# Patient Record
Sex: Female | Born: 1955 | Race: White | Hispanic: No | Marital: Single | Smoking: Former smoker
Health system: Southern US, Community
[De-identification: ages and names within clinical notes are randomized; demographics above are authoritative.]

## PROBLEM LIST (undated history)

## (undated) DIAGNOSIS — J8410 Pulmonary fibrosis, unspecified: Secondary | ICD-10-CM

## (undated) HISTORY — PX: ABDOMINAL HYSTERECTOMY: SHX81

---

## 2007-03-18 ENCOUNTER — Other Ambulatory Visit: Payer: Self-pay

## 2007-03-18 ENCOUNTER — Inpatient Hospital Stay: Payer: Self-pay | Admitting: Internal Medicine

## 2007-03-26 ENCOUNTER — Other Ambulatory Visit: Payer: Self-pay

## 2007-03-26 ENCOUNTER — Emergency Department: Payer: Self-pay | Admitting: Emergency Medicine

## 2007-10-27 ENCOUNTER — Emergency Department: Payer: Self-pay | Admitting: Emergency Medicine

## 2007-10-27 ENCOUNTER — Other Ambulatory Visit: Payer: Self-pay

## 2015-12-22 ENCOUNTER — Emergency Department: Payer: Medicaid Other

## 2015-12-22 ENCOUNTER — Encounter: Payer: Self-pay | Admitting: *Deleted

## 2015-12-22 ENCOUNTER — Emergency Department
Admission: EM | Admit: 2015-12-22 | Discharge: 2015-12-22 | Disposition: A | Discharge status: Other Acute Inpatient Hospital | Payer: Medicaid Other | Attending: Nurse Practitioner | Admitting: Nurse Practitioner

## 2015-12-22 DIAGNOSIS — T39395A Adverse effect of other nonsteroidal anti-inflammatory drugs [NSAID], initial encounter: Secondary | ICD-10-CM

## 2015-12-22 DIAGNOSIS — R042 Hemoptysis: Secondary | ICD-10-CM | POA: Diagnosis present

## 2015-12-22 DIAGNOSIS — Z87891 Personal history of nicotine dependence: Secondary | ICD-10-CM | POA: Insufficient documentation

## 2015-12-22 DIAGNOSIS — N189 Chronic kidney disease, unspecified: Secondary | ICD-10-CM | POA: Diagnosis not present

## 2015-12-22 DIAGNOSIS — K922 Gastrointestinal hemorrhage, unspecified: Secondary | ICD-10-CM | POA: Diagnosis not present

## 2015-12-22 LAB — BLOOD GAS, VENOUS
ACID-BASE EXCESS: 9.7 mmol/L — AB (ref 0.0–2.0)
BICARBONATE: 37.2 mmol/L — AB (ref 20.0–28.0)
O2 SAT: 76.6 %
PCO2 VEN: 69 mmHg — AB (ref 44.0–60.0)
PH VEN: 7.34 (ref 7.250–7.430)
Patient temperature: 37
pO2, Ven: 44 mmHg (ref 32.0–45.0)

## 2015-12-22 LAB — COMPREHENSIVE METABOLIC PANEL
ALK PHOS: 143 U/L — AB (ref 38–126)
ALT: 7 U/L — ABNORMAL LOW (ref 14–54)
ANION GAP: 9 (ref 5–15)
AST: 12 U/L — ABNORMAL LOW (ref 15–41)
Albumin: 3.2 g/dL — ABNORMAL LOW (ref 3.5–5.0)
BILIRUBIN TOTAL: 0.6 mg/dL (ref 0.3–1.2)
BUN: 17 mg/dL (ref 6–20)
CALCIUM: 9.3 mg/dL (ref 8.9–10.3)
CO2: 33 mmol/L — ABNORMAL HIGH (ref 22–32)
Chloride: 98 mmol/L — ABNORMAL LOW (ref 101–111)
Creatinine, Ser: 1.57 mg/dL — ABNORMAL HIGH (ref 0.44–1.00)
GFR calc Af Amer: 41 mL/min — ABNORMAL LOW (ref >60)
GFR, EST NON AFRICAN AMERICAN: 35 mL/min — AB (ref >60)
GLUCOSE: 126 mg/dL — AB (ref 65–99)
POTASSIUM: 4.1 mmol/L (ref 3.5–5.1)
Sodium: 140 mmol/L (ref 135–145)
TOTAL PROTEIN: 6.9 g/dL (ref 6.5–8.1)

## 2015-12-22 LAB — TYPE AND SCREEN
ABO/RH(D): O POS
Antibody Screen: NEGATIVE

## 2015-12-22 LAB — APTT: APTT: 33 s (ref 24–36)

## 2015-12-22 LAB — CBC
HEMATOCRIT: 31.7 % — AB (ref 35.0–47.0)
HEMOGLOBIN: 9.9 g/dL — AB (ref 12.0–16.0)
MCH: 27.4 pg (ref 26.0–34.0)
MCHC: 31.2 g/dL — AB (ref 32.0–36.0)
MCV: 87.6 fL (ref 80.0–100.0)
Platelets: 248 10*3/uL (ref 150–440)
RBC: 3.62 MIL/uL — ABNORMAL LOW (ref 3.80–5.20)
RDW: 14.6 % — ABNORMAL HIGH (ref 11.5–14.5)
WBC: 13.3 10*3/uL — ABNORMAL HIGH (ref 3.6–11.0)

## 2015-12-22 LAB — LACTIC ACID, PLASMA: Lactic Acid, Venous: 0.9 mmol/L (ref 0.5–1.9)

## 2015-12-22 LAB — PROTIME-INR
INR: 0.99
Prothrombin Time: 13.1 seconds (ref 11.4–15.2)

## 2015-12-22 MED ORDER — HYDROMORPHONE HCL 1 MG/ML IJ SOLN
0.5000 mg | Freq: Once | INTRAMUSCULAR | Status: AC
  Administered 2015-12-22: 0.5 mg via INTRAVENOUS
  Filled 2015-12-22: qty 1

## 2015-12-22 MED ORDER — METHYLPREDNISOLONE SODIUM SUCC 125 MG IJ SOLR
125.0000 mg | Freq: Once | INTRAMUSCULAR | Status: AC
Start: 2015-12-22 — End: 2015-12-22
  Administered 2015-12-22: 125 mg via INTRAVENOUS
  Filled 2015-12-22: qty 2

## 2015-12-22 MED ORDER — SODIUM CHLORIDE 0.9 % IV SOLN
80.0000 mg | Freq: Once | INTRAVENOUS | Status: AC
  Administered 2015-12-22: 80 mg via INTRAVENOUS
  Filled 2015-12-22: qty 80

## 2015-12-22 MED ORDER — SODIUM CHLORIDE 0.9 % IV BOLUS (SEPSIS)
1000.0000 mL | Freq: Once | INTRAVENOUS | Status: AC
  Administered 2015-12-22: 1000 mL via INTRAVENOUS

## 2015-12-22 MED ORDER — LEVOFLOXACIN IN D5W 750 MG/150ML IV SOLN
750.0000 mg | Freq: Once | INTRAVENOUS | Status: AC
  Administered 2015-12-22: 750 mg via INTRAVENOUS
  Filled 2015-12-22: qty 150

## 2015-12-22 MED ORDER — SODIUM CHLORIDE 0.9 % IV SOLN
8.0000 mg/h | INTRAVENOUS | Status: DC
  Administered 2015-12-22: 8 mg/h via INTRAVENOUS
  Filled 2015-12-22 (×2): qty 80

## 2015-12-22 MED ORDER — IPRATROPIUM-ALBUTEROL 0.5-2.5 (3) MG/3ML IN SOLN
3.0000 mL | Freq: Once | RESPIRATORY_TRACT | Status: AC
  Administered 2015-12-22: 3 mL via RESPIRATORY_TRACT
  Filled 2015-12-22: qty 3

## 2015-12-22 NOTE — ED Provider Notes (Signed)
[facility] Emergency Department Provider Note  ____________________________________________  Time seen: Approximately 3:38 PM  I have reviewed the triage vital signs and the nursing notes.   HISTORY  Chief Complaint Hemoptysis    HPI Tricia Montgomery is a 60 y.o. female with a history of idiopathic pulmonary fibrosis on 16 L of oxygen by nasal cannula at home, known gastric ulcers recently started on Mobitz for arthritis pain, CKD, presenting with hemoptysis and black stool.The patient reports that last week she had a worsening of her chronic cough associated with a change in her sputum, which became green and thick, with pink blood-tinged. Over the last several days, the sputum has improved but now she is having frank hemoptysis with continued increased cough. She has associated worsening of her shortness of breath, which is worse with exertion. She also has a central chest tightness sensation. She has had lightheadedness with standing but no syncope. No palpitations, no fever. Positive chills. No lower extremity swelling or calf pain. In addition, the patient notes that for the last several days she has had black colored stools; no nausea or vomiting, no epigastric pain. In addition, the patient reports significant anxiety when she feels short of breath. The patient is not anticoagulated.   No past medical history on file.  There are no active problems to display for this patient.   No past surgical history on file.    Allergies Review of patient's allergies indicates no known allergies.  No family history on file.  Social History Social History  Substance Use Topics  . Smoking status: Former Games developer  . Smokeless tobacco: Never Used  . Alcohol use No    Review of Systems Constitutional:No fever. Positive chills. Positive lightheadedness. Negative syncope. Eyes: No visual changes. ENT: No sore throat. No congestion or rhinorrhea. Cardiovascular:  Positive chest pain. Denies palpitations. Respiratory: Positive shortness of breath.  Positive cough. Positive hemoptysis. Gastrointestinal: No abdominal pain.  No nausea, no vomiting.  No diarrhea.  No constipation. Positive black stool. Genitourinary: Negative for dysuria. Musculoskeletal: Positive for chronic unchanged back pain. Skin: Negative for rash. Neurological: Negative for headaches. No focal numbness, tingling or weakness.  Psychiatric:Positive anxiety.  10-point ROS otherwise negative.  ____________________________________________   PHYSICAL EXAM:  VITAL SIGNS: ED Triage Vitals  Enc Vitals Group     BP 12/22/15 1523 105/69     Pulse Rate 12/22/15 1523 80     Resp 12/22/15 1523 (!) 22     Temp 12/22/15 1523 98.4 F (36.9 C)     Temp Source 12/22/15 1523 Oral     SpO2 12/22/15 1523 96 %     Weight 12/22/15 1525 200 lb (90.7 kg)     Height 12/22/15 1525 5\' 5"  (1.651 m)     Head Circumference --      Peak Flow --      Pain Score 12/22/15 1525 9     Pain Loc --      Pain Edu? --      Excl. in GC? --     Constitutional: Alert and oriented. Chronically ill appearing but nontoxic Answers questions appropriately. Eyes: Conjunctivae are normal and without pallor.  EOMI. No scleral icterus. Head: Atraumatic. Nose: No congestion/rhinnorhea. Mouth/Throat: Mucous membranes are moist.  Neck: No stridor.  Supple.  No JVD. No meningismus. Cardiovascular: Normal rate, regular rhythm. No murmurs, rubs or gallops.  Respiratory: Tachypnea with accessory muscle use and retractions. Oxygenation is 99% on baseline a liters nasal cannula. Diffuse  expiratory greater than inspiratory wheezing bilaterally. Rales in the bases bilaterally. Fair air exchange. Gastrointestinal: Obese. Soft, nontender and nondistended.  No guarding or rebound.  No peritoneal signs. Genitourinary: Nonthrombosed, nonbleeding external hemorrhoids. No palpable internal hemorrhoids. No pain with rectal  examination. Stool is brown and guaiac positive. Musculoskeletal: No LE edema. No ttp in the calves or palpable cords.  Negative Homan's sign. Neurologic:  A&Ox3.  Speech is clear.  Face and smile are symmetric.  EOMI.  Moves all extremities well. Skin:  Skin is warm, dry and intact. No rash noted. Psychiatric: Mood and affect are normal. Speech and behavior are normal.  Normal judgement.  ____________________________________________   LABS (all labs ordered are listed, but only abnormal results are displayed)  Labs Reviewed  CBC - Abnormal; Notable for the following:       Result Value   WBC 13.3 (*)    RBC 3.62 (*)    Hemoglobin 9.9 (*)    HCT 31.7 (*)    MCHC 31.2 (*)    RDW 14.6 (*)    All other components within normal limits  COMPREHENSIVE METABOLIC PANEL - Abnormal; Notable for the following:    Chloride 98 (*)    CO2 33 (*)    Glucose, Bld 126 (*)    Creatinine, Ser 1.57 (*)    Albumin 3.2 (*)    AST 12 (*)    ALT 7 (*)    Alkaline Phosphatase 143 (*)    GFR calc non Af Amer 35 (*)    GFR calc Af Amer 41 (*)    All other components within normal limits  BLOOD GAS, VENOUS - Abnormal; Notable for the following:    pCO2, Ven 69 (*)    Bicarbonate 37.2 (*)    Acid-Base Excess 9.7 (*)    All other components within normal limits  CULTURE, BLOOD (ROUTINE X 2)  CULTURE, BLOOD (ROUTINE X 2)  APTT  PROTIME-INR  LACTIC ACID, PLASMA  LACTIC ACID, PLASMA  TROPONIN I  TYPE AND SCREEN   ____________________________________________  EKG  ED ECG REPORT I, Rockne Menghini, the attending physician, personally viewed and interpreted this ECG.   Date: 12/22/2015  EKG Time: 1533  Rate: 83  Rhythm: normal sinus rhythm  Axis: leftward  Intervals:none  ST&T Change: Poor baseline tracing but no obvious ST elevation or depression.  ____________________________________________  RADIOLOGY  No results  found.  ____________________________________________   PROCEDURES  Procedure(s) performed: None  Procedures  Critical Care performed: Yes, see critical care note(s) ____________________________________________   INITIAL IMPRESSION / ASSESSMENT AND PLAN / ED COURSE  Pertinent labs & imaging results that were available during my care of the patient were reviewed by me and considered in my medical decision making (see chart for details).  60 y.o. female with a history of idiopathic pulmonary fibrosis on supplemental oxygen, known ulcers, not anticoagulated but recently placed on low back for arthritic pain, presenting with cough, shortness of breath, chills, hemoptysis, and black stools with a guaiac positive finding on examination. Overall, the patient has a reassuring blood pressure and heart rate and is mentating well. However she is actively coughing up blood. Now that she has a guaiac positive test as well, I am unable to admit her to the hospital [facility] due to not having GI coverage. The patient has all her primary pulmonary and gastroenterology physicians at [facility], so called the transfer center. I am concerned that the patient may have hemoptysis from an underlying  either viral or bacterial pulmonary infection, and we'll treat her with empiric antibiotics immediately. She is also at risk for PE, however, states that she has CKD and has not been eligible for CT angiogram in the past. I will evaluate her creatinine when he comes back.  At this time however the patient is not more hypoxic than baseline, is not unstable, so imaging can be deferred for kidney protection until we have further data. She may also have bleeding gastric ulcers, and I will initiate Protonix. If she is anemic, we will order a blood transfusion here. I'll continue to monitor the patient's vital signs and her clinical course for further treatment decisions.  CRITICAL CARE Performed by: Rockne MenghiniNorman,  Anne-Caroline   Total critical care time: 60 minutes  Critical care time was exclusive of separately billable procedures and treating other patients.  Critical care was necessary to treat or prevent imminent or life-threatening deterioration.  Critical care was time spent personally by me on the following activities: development of treatment plan with patient and/or surrogate as well as nursing, discussions with consultants, evaluation of patient's response to treatment, examination of patient, obtaining history from patient or surrogate, ordering and performing treatments and interventions, ordering and review of laboratory studies, ordering and review of radiographic studies, pulse oximetry and re-evaluation of patient's condition.  ----------------------------------------- 4:41 PM on 12/22/2015 -----------------------------------------  At this time, the patient remains hemodynamically stable. [facility]. Her hemoglobin and hematocrit are stable compared to previous, and no transfusion is indicated at this time. She has received IV antibiotics, as well as Solu-Medrol and a DuoNeb. [facility] is aware that I have held CT angiography due to the patient's chronic renal insufficiency.  ____________________________________________  FINAL CLINICAL IMPRESSION(S) / ED DIAGNOSES  Final diagnoses:  Hemoptysis  GI bleed due to NSAIDs    Clinical Course      NEW MEDICATIONS STARTED DURING THIS VISIT:  New Prescriptions   No medications on file       Rockne MenghiniAnne-Caroline Brookelyn Gaynor, MD 12/22/15 1642

## 2015-12-22 NOTE — ED Triage Notes (Addendum)
Pt to triage via wheelchair.  Pt on 6-8 liters oxygen at home.  Pt has a form of fibrosis of lungs.  Former smoker.  Intermittent sob.  Pt states she is coughing up blood and it  Is worse during past 3 days.  Pt coughing up bright red blood in triage.   Pt alert.

## 2015-12-27 LAB — CULTURE, BLOOD (ROUTINE X 2)
CULTURE: NO GROWTH
Culture: NO GROWTH

## 2016-01-10 ENCOUNTER — Encounter: Payer: Self-pay | Admitting: Emergency Medicine

## 2016-01-10 ENCOUNTER — Emergency Department: Payer: Medicaid Other

## 2016-01-10 ENCOUNTER — Emergency Department
Admission: EM | Admit: 2016-01-10 | Discharge: 2016-01-10 | Disposition: A | Discharge status: Home / Self Care | Payer: Medicaid Other | Attending: Emergency Medicine | Admitting: Emergency Medicine

## 2016-01-10 DIAGNOSIS — Z79899 Other long term (current) drug therapy: Secondary | ICD-10-CM | POA: Insufficient documentation

## 2016-01-10 DIAGNOSIS — J9601 Acute respiratory failure with hypoxia: Secondary | ICD-10-CM

## 2016-01-10 DIAGNOSIS — R401 Stupor: Secondary | ICD-10-CM

## 2016-01-10 DIAGNOSIS — R4182 Altered mental status, unspecified: Secondary | ICD-10-CM | POA: Diagnosis present

## 2016-01-10 DIAGNOSIS — Z87891 Personal history of nicotine dependence: Secondary | ICD-10-CM | POA: Diagnosis not present

## 2016-01-10 DIAGNOSIS — R0602 Shortness of breath: Secondary | ICD-10-CM | POA: Diagnosis not present

## 2016-01-10 HISTORY — DX: Pulmonary fibrosis, unspecified: J84.10

## 2016-01-10 LAB — CBC WITH DIFFERENTIAL/PLATELET
BASOS ABS: 0 10*3/uL (ref 0–0.1)
BASOS PCT: 0 %
Eosinophils Absolute: 0 10*3/uL (ref 0–0.7)
Eosinophils Relative: 0 %
HEMATOCRIT: 33.8 % — AB (ref 35.0–47.0)
HEMOGLOBIN: 10.4 g/dL — AB (ref 12.0–16.0)
LYMPHS PCT: 4 %
Lymphs Abs: 0.4 10*3/uL — ABNORMAL LOW (ref 1.0–3.6)
MCH: 27.4 pg (ref 26.0–34.0)
MCHC: 30.6 g/dL — ABNORMAL LOW (ref 32.0–36.0)
MCV: 89.3 fL (ref 80.0–100.0)
Monocytes Absolute: 0.8 10*3/uL (ref 0.2–0.9)
Monocytes Relative: 7 %
NEUTROS ABS: 10.8 10*3/uL — AB (ref 1.4–6.5)
NEUTROS PCT: 89 %
Platelets: 478 10*3/uL — ABNORMAL HIGH (ref 150–440)
RBC: 3.78 MIL/uL — AB (ref 3.80–5.20)
RDW: 15.5 % — ABNORMAL HIGH (ref 11.5–14.5)
WBC: 12.1 10*3/uL — AB (ref 3.6–11.0)

## 2016-01-10 LAB — COMPREHENSIVE METABOLIC PANEL
ALBUMIN: 3.6 g/dL (ref 3.5–5.0)
ALT: 10 U/L — AB (ref 14–54)
AST: 18 U/L (ref 15–41)
Alkaline Phosphatase: 168 U/L — ABNORMAL HIGH (ref 38–126)
Anion gap: 7 (ref 5–15)
BILIRUBIN TOTAL: 0.3 mg/dL (ref 0.3–1.2)
BUN: 14 mg/dL (ref 6–20)
CO2: 40 mmol/L — ABNORMAL HIGH (ref 22–32)
CREATININE: 1.58 mg/dL — AB (ref 0.44–1.00)
Calcium: 8.9 mg/dL (ref 8.9–10.3)
Chloride: 91 mmol/L — ABNORMAL LOW (ref 101–111)
GFR calc Af Amer: 41 mL/min — ABNORMAL LOW (ref >60)
GFR, EST NON AFRICAN AMERICAN: 35 mL/min — AB (ref >60)
GLUCOSE: 180 mg/dL — AB (ref 65–99)
Potassium: 3.8 mmol/L (ref 3.5–5.1)
Sodium: 138 mmol/L (ref 135–145)
TOTAL PROTEIN: 7.5 g/dL (ref 6.5–8.1)

## 2016-01-10 LAB — BLOOD GAS, ARTERIAL
ACID-BASE EXCESS: 16.3 mmol/L — AB (ref 0.0–2.0)
BICARBONATE: 46.9 mmol/L — AB (ref 20.0–28.0)
FIO2: 1
O2 Saturation: 99.6 %
PCO2 ART: 107 mmHg — AB (ref 32.0–48.0)
PH ART: 7.25 — AB (ref 7.350–7.450)
PO2 ART: 201 mmHg — AB (ref 83.0–108.0)
Patient temperature: 37

## 2016-01-10 LAB — URINALYSIS COMPLETE WITH MICROSCOPIC (ARMC ONLY)
Bilirubin Urine: NEGATIVE
Glucose, UA: NEGATIVE mg/dL
Hgb urine dipstick: NEGATIVE
KETONES UR: NEGATIVE mg/dL
NITRITE: POSITIVE — AB
PH: 5 (ref 5.0–8.0)
PROTEIN: NEGATIVE mg/dL
SPECIFIC GRAVITY, URINE: 1.011 (ref 1.005–1.030)

## 2016-01-10 LAB — BRAIN NATRIURETIC PEPTIDE: B Natriuretic Peptide: 119 pg/mL — ABNORMAL HIGH (ref 0.0–100.0)

## 2016-01-10 LAB — LACTIC ACID, PLASMA: LACTIC ACID, VENOUS: 1.3 mmol/L (ref 0.5–1.9)

## 2016-01-10 LAB — PROTIME-INR
INR: 1
Prothrombin Time: 13.2 seconds (ref 11.4–15.2)

## 2016-01-10 LAB — TYPE AND SCREEN
ABO/RH(D): O POS
Antibody Screen: NEGATIVE

## 2016-01-10 LAB — GLUCOSE, CAPILLARY: GLUCOSE-CAPILLARY: 186 mg/dL — AB (ref 65–99)

## 2016-01-10 LAB — TROPONIN I: Troponin I: 0.04 ng/mL (ref <0.03)

## 2016-01-10 LAB — LIPASE, BLOOD: LIPASE: 13 U/L (ref 11–51)

## 2016-01-10 MED ORDER — SODIUM CHLORIDE 0.9 % IV BOLUS (SEPSIS)
1000.0000 mL | Freq: Once | INTRAVENOUS | Status: AC
  Administered 2016-01-10: 1000 mL via INTRAVENOUS

## 2016-01-10 NOTE — ED Notes (Signed)
Patient family instructed on catheter care for patient

## 2016-01-10 NOTE — ED Notes (Signed)
Dr. Fanny BienQuale at bedside preparing to intubate

## 2016-01-10 NOTE — ED Notes (Addendum)
Patient [facility] arrived at bedside, Dr. Fanny BienQuale currently speaking with daughter. Per daughter patient does not want to be intubated or resuscitated.

## 2016-01-10 NOTE — ED Provider Notes (Signed)
[facility] Emergency Department Provider Note   ____________________________________________   First MD Initiated Contact with Patient 01/10/16 1000     (approximate)  I have reviewed the triage vital signs and the nursing notes.   HISTORY  Chief Complaint Respiratory Distress and Altered Mental Status  EM caveat: The patient is completely obtunded and unresponsive  HPI Tricia Montgomery is a 60 y.o. female the previous history of severe interstitial fibrosis  EMS reports that the patient became or was found unresponsive today, had vomited once, and has rales throughout the lungs. She cannot tolerate CPAP with her altered mental status, and her oxygen saturation was notably low and hypoxic with some improvement on nonrebreather  The patient's daughter arrived at about 10:30 AM, she is the patient's healthcare power attorney  She last talked to her mother yesterday and said that she sounded normal on the phone, this morning she reports that family who she was living with found her unresponsive and having trouble breathing. The daughter affirms to me that her mother is DO NOT RESUSCITATE, she would not want anything heroic done when we discussed it, and they may be amenable to fluids, antibiotics, relief of pain and anxiety but certainly do not wish for her to have CPR, or any form of resuscitation or mechanical ventilation.  discussed with [facility] who follows her, they're coming to see and evaluate the patient in the ER now  Past Medical History:  Diagnosis Date  . Pulmonary fibrosis (HCC)     There are no active problems to display for this patient.   Past Surgical History:  Procedure Laterality Date  . ABDOMINAL HYSTERECTOMY      Prior to Admission medications   Medication Sig Start Date End Date Taking? Authorizing Provider  acetaminophen (TYLENOL) 500 MG chewable tablet Chew 1,000 mg by mouth 3 (three) times daily as needed for pain.    Yes Historical Provider, MD  albuterol (PROVENTIL HFA;VENTOLIN HFA) 108 (90 Base) MCG/ACT inhaler Inhale 2 puffs into the lungs every 6 (six) hours as needed for wheezing.   Yes Historical Provider, MD  beclomethasone (QVAR) 80 MCG/ACT inhaler Inhale 2 puffs into the lungs 2 (two) times daily.   Yes Historical Provider, MD  calcitRIOL (ROCALTROL) 0.5 MCG capsule Take 0.5 mcg by mouth daily.   Yes Historical Provider, MD  cyclobenzaprine (FLEXERIL) 5 MG tablet Take 10 mg by mouth 3 (three) times daily as needed for muscle spasms.   Yes Historical Provider, MD  diazepam (VALIUM) 2 MG tablet Take 2 mg by mouth every 6 (six) hours as needed for anxiety.   Yes Historical Provider, MD  epoetin alfa (EPOGEN,PROCRIT) 16109 UNIT/ML injection Inject 10,000 Units into the skin 3 (three) times a week. On Monday, Wednesday, and Saturday   Yes Historical Provider, MD  furosemide (LASIX) 40 MG tablet Take 40 mg by mouth 2 (two) times daily.   Yes Historical Provider, MD  gabapentin (NEURONTIN) 300 MG capsule Take 300 mg by mouth 3 (three) times daily. Along with gabapentin 400 mg   Yes Historical Provider, MD  gabapentin (NEURONTIN) 400 MG capsule Take 400 mg by mouth 3 (three) times daily. Along with gabapentin 300 mg   Yes Historical Provider, MD  HYDROmorphone (DILAUDID) 2 MG tablet Take 2 mg by mouth every 4 (four) hours as needed for moderate pain.   Yes Historical Provider, MD  meloxicam (MOBIC) 15 MG tablet Take 15 mg by mouth daily.   Yes Historical Provider, MD  mycophenolate (CELLCEPT) 500 MG tablet Take 1,500 mg by mouth 2 (two) times daily.   Yes Historical Provider, MD  nitroGLYCERIN (NITROSTAT) 0.4 MG SL tablet Place 0.4 mg under the tongue every 5 (five) minutes as needed for chest pain.   Yes Historical Provider, MD  omeprazole (PRILOSEC) 20 MG capsule Take 40 mg by mouth 2 (two) times daily with a meal.   Yes Historical Provider, MD  promethazine (PHENERGAN) 25 MG tablet Take 25 mg by mouth every 8  (eight) hours as needed for nausea.   Yes Historical Provider, MD  tiotropium (SPIRIVA) 18 MCG inhalation capsule Place 18 mcg into inhaler and inhale daily.   Yes Historical Provider, MD  verapamil (CALAN-SR) 240 MG CR tablet Take 240 mg by mouth 2 (two) times daily.   Yes Historical Provider, MD    Allergies Review of patient's allergies indicates no known allergies.  No family history on file.  Social History Social History  Substance Use Topics  . Smoking status: Former Games developer  . Smokeless tobacco: Never Used  . Alcohol use No    Review of Systems EM caveat  ____________________________________________   PHYSICAL EXAM:  VITAL SIGNS: ED Triage Vitals  Enc Vitals Group     BP 01/10/16 1009 110/74     Pulse Rate 01/10/16 1009 100     Resp --      Temp --      Temp src --      SpO2 01/10/16 1009 99 %     Weight 01/10/16 1016 207 lb 0.2 oz (93.9 kg)     Height 01/10/16 1016 5\' 1"  (1.549 m)     Head Circumference --      Peak Flow --      Pain Score --      Pain Loc --      Pain Edu? --      Excl. in GC? --   Constitutional: Essentially completely unresponsive. Patient obtunded, responds only to painful stimuli with grimacing. Nonverbal Eyes: Conjunctivae are normal. Pupils are midpoint, reactive but slow Head: Atraumatic. Nose: No congestion/rhinnorhea. Mouth/Throat: Mucous membranes are very dry, edentulous.  Oropharynx non-erythematous. Neck: No stridor.   Cardiovascular: Tachycardic rate, regular rhythm. Grossly normal heart sounds.  Good peripheral circulation. Respiratory: Diminished, somewhat inadequate respirations though not agonal. The patient has rales throughout. Gastrointestinal: Soft and nontender. No distention.  Musculoskeletal: No lower extremity tenderness noted though the patient does have 1+ lower extremity edema bilateral Neurologic:  Flaccid in all extremities. Obtunded, does withdraw and react to painful stimuli only. Occasionally opens eyes  to voice but does not Indicate back. Patient felt to be obtunded, Skin:  Skin is warm, dry and intact. No rash noted.   ____________________________________________   LABS (all labs ordered are listed, but only abnormal results are displayed)  Labs Reviewed  GLUCOSE, CAPILLARY - Abnormal; Notable for the following:       Result Value   Glucose-Capillary 186 (*)    All other components within normal limits  CBC WITH DIFFERENTIAL/PLATELET - Abnormal; Notable for the following:    WBC 12.1 (*)    RBC 3.78 (*)    Hemoglobin 10.4 (*)    HCT 33.8 (*)    MCHC 30.6 (*)    RDW 15.5 (*)    Platelets 478 (*)    Neutro Abs 10.8 (*)    Lymphs Abs 0.4 (*)    All other components within normal limits  COMPREHENSIVE METABOLIC PANEL - Abnormal; Notable for  the following:    Chloride 91 (*)    CO2 40 (*)    Glucose, Bld 180 (*)    Creatinine, Ser 1.58 (*)    ALT 10 (*)    Alkaline Phosphatase 168 (*)    GFR calc non Af Amer 35 (*)    GFR calc Af Amer 41 (*)    All other components within normal limits  TROPONIN I - Abnormal; Notable for the following:    Troponin I 0.04 (*)    All other components within normal limits  URINALYSIS COMPLETEWITH MICROSCOPIC ([facility] ONLY) - Abnormal; Notable for the following:    Color, Urine YELLOW (*)    APPearance CLEAR (*)    Nitrite POSITIVE (*)    Leukocytes, UA TRACE (*)    Bacteria, UA MANY (*)    Squamous Epithelial / LPF 0-5 (*)    All other components within normal limits  BRAIN NATRIURETIC PEPTIDE - Abnormal; Notable for the following:    B Natriuretic Peptide 119.0 (*)    All other components within normal limits  BLOOD GAS, ARTERIAL - Abnormal; Notable for the following:    pH, Arterial 7.25 (*)    pCO2 arterial 107 (*)    pO2, Arterial 201 (*)    Bicarbonate 46.9 (*)    Acid-Base Excess 16.3 (*)    All other components within normal limits  CULTURE, BLOOD (ROUTINE X 2)  CULTURE, BLOOD (ROUTINE X 2)  URINE CULTURE  LACTIC ACID,  PLASMA  PROTIME-INR  LIPASE, BLOOD  TYPE AND SCREEN   ____________________________________________  EKG  Reviewed and to revive me at 10:05 AM Heart rate 100 QRS 100 QTc 4:30 Sinus tachycardia, slight left axis deviation Nonspecific intraventricular conduction delay, no evidence of acute ischemic change noted ____________________________________________  RADIOLOGY  Dg Chest Port 1 View  Result Date: 01/10/2016 CLINICAL DATA:  Pulmonary fibrosis, altered mental status, respiratory distress, concern for sepsis. EXAM: PORTABLE CHEST 1 VIEW COMPARISON:  12/22/2015 FINDINGS: Limited exam because of obese body habitus and portable technique. Low lung volumes again evident with background extensive reticulonodular interstitial and asymmetric opacities compatible with pulmonary fibrosis. Heart is mildly enlarged with central vascular congestion evident possible small pleural effusions. Difficult to exclude a component of superimposed mild edema. Large hiatal hernia suspected over the cardiac silhouette. Stable tracheal deviation to the right. Aorta is ectatic and atherosclerotic. Monitor leads overlie the chest. IMPRESSION: Low volume exam with chronic asymmetric pulmonary fibrosis pattern. Cardiac enlargement with central vascular congestion and findings suspicious for superimposed edema pattern with small effusions. Large hiatal hernia No pneumothorax Electronically Signed   By: Judie PetitM.  Shick M.D.   On: 01/10/2016 10:53    ____________________________________________   PROCEDURES  Procedure(s) performed: Intraosseous line   Patient identity affirm. Due to lack of IV access, the patient's acute obtundation and consideration for need for rapid sequence intubation (this was prior to obtaining the patient was DO NOT RESUSCITATE and on hospice), I personally placed a intraosseous access and the patient's left tibia. Cleaned with normal aseptic technique, placed line with no complication on first  attempt and withdraws marrow like blood and flush as well without difficulty. Secured.  Procedures  Critical Care performed: Yes, see critical care note(s)  CRITICAL CARE Performed by: Sharyn CreamerQUALE, MARK   Total critical care time: 45 minutes  Critical care time was exclusive of separately billable procedures and treating other patients.  Critical care was necessary to treat or prevent imminent or life-threatening deterioration.  Critical care was  time spent personally by me on the following activities: development of treatment plan with patient and/or surrogate as well as nursing, discussions with consultants, evaluation of patient's response to treatment, examination of patient, obtaining history from patient or surrogate, ordering and performing treatments and interventions, ordering and review of laboratory studies, ordering and review of radiographic studies, pulse oximetry and re-evaluation of patient's condition.  ____________________________________________   INITIAL IMPRESSION / ASSESSMENT AND PLAN / ED COURSE  Pertinent labs & imaging results that were available during my care of the patient were reviewed by me and considered in my medical decision making (see chart for details).  Patient presents with severe obtundation. Appears possible respiratory with rales throughout. She had hypoxia at home, and evidently has end-stage pulmonary fibrosis. The patient is now confirmed DO NOT RESUSCITATE.  Discussed with the patient's daughter, she does not wish for her mother to have a CT of the head given that they would not and she could not have any surgery, that she is DO NOT RESUSCITATE and on hospice. I think this is reasonable. Her does report that they are amenable to having basic blood work and antibiotics if needed at this time, but their goal of care is for the patient revealed enter hospice care and are waiting for hospice team to arrive.  Clinical Course     ----------------------------------------- 12:04 PM on 01/10/2016 -----------------------------------------  Patient family discussing with themselves and hospitalist team plan of care. We've offered admission, antibiotics, as well as admission for palliative services if they cannot arrange her do not wish for this. The patient's daughter strongly wishes to be able to go home and potentially provide care herself if they're able to arrange for assistance from family and hospice as well. At this time antibiotics have been delayed as the family is debating whether they would even wish to pursue further treatment or if they want to go home with comfort measures only and hospice services. Continue to await the family's final decision.  ----------------------------------------- 1:27 PM on 01/10/2016 -----------------------------------------  After extensive discussion between family, hospice, and discussing patient's choices with myself patient's healthcare power of attorney, [place] who is the daughter, advises that they have arranged and wish for the patient to be discharged back to her sister's home or hospice will be assisting them with her cares (confirmed with hospice who is here and present for decision). They have medical equipment including concentrator, bed there and the hospice team and nurse Erie Noe is agreeable with this plan. Family does not wish for the patient to receive any further medical treatment beyond that which is for her comfort only. We will not provide any antibiotics, and the family understands that there is a high probability the patient has a terminal condition but we do not know the exact time when she may die. Patient being discharged with ambulance taking her back, hospice team will continue to be present and follow with her at home assisting the family.  ----------------------------------------- 1:32 PM on  01/10/2016 -----------------------------------------  Intraosseous access removed prior to transport back to home. No complications. ____________________________________________   FINAL CLINICAL IMPRESSION(S) / ED DIAGNOSES  Final diagnoses:  Acute hypoxemic respiratory failure (HCC)  Obtundation      NEW MEDICATIONS STARTED DURING THIS VISIT:  New Prescriptions   No medications on file     Note:  This document was prepared using Dragon voice recognition software and may include unintentional dictation errors.     Sharyn Creamer, MD 01/10/16 1340

## 2016-01-10 NOTE — ED Notes (Signed)
Called EMS for transport to home, [phone]

## 2016-01-10 NOTE — ED Notes (Signed)
[facility] from [facility] called to check status of patient. Dr. Fanny BienQuale [facility] who stated that [facility] has D[facility]. Dr. Fanny BienQuale was given the number for patients daughter, Philippa ChesterBrittney, who is patients POA. Dr. Fanny BienQuale attempted to reach her but was unable to. Dr. Fanny BienQuale to hold off on intubation until able to speak with Brittney.

## 2016-01-10 NOTE — Progress Notes (Signed)
Pt was unconscious. Present in room were two daughters Florentina AddisonKatie and [place]any.  [place]any is Healthcare POA. Siblings (4) in stress over deciding path forward. [place]any is clear on no life-prolonging measures to be taken but others very upset by this direction.  Hospice has been called in. DR entered room and explained choices of teating URI but said nothing to be done about Cardio Pulmanary disease.  Family will tak over opitons and [facility]

## 2016-01-10 NOTE — ED Triage Notes (Signed)
Patient brought in by [facility] from home, per EMS family called stating that patient has slummed over and was acting altered, symptoms started approx. 1 hour PTA. EMS reports patient vomited x 1. On arrival to ED patient is on non rebreather and slummed over, patient is responsive to voice.

## 2016-01-10 NOTE — ED Notes (Signed)
Patient unable to sign due to her mental status. DC papers were sent with EMS. Dr. Fanny BienQuale spoke with family about further care instructions before family left.

## 2016-01-13 LAB — URINE CULTURE: Culture: >=100000 — AB

## 2016-01-14 NOTE — ED Provider Notes (Signed)
-----------------------------------------   3:25 PM on 01/14/2016 -----------------------------------------  Pharmacist discussed with me the positive urine culture results.  However, I reviewed the chart and the family clearly expressed their desire to not pursue any additional treatment including no antibiotics.  As a result we have not called in a prescription for this patient who is on comfort care only.   Loleta Roseory Diangelo Radel, MD 01/14/16 [phone]

## 2016-01-15 LAB — CULTURE, BLOOD (ROUTINE X 2)
CULTURE: NO GROWTH
CULTURE: NO GROWTH

## 2016-01-21 DEATH — deceased

## 2018-02-11 IMAGING — CR DG CHEST 2V
1 series · 2 of 2 positions shown · non-contrast
Comparison: 11/26/2015.

CLINICAL DATA: Intermittent shortness of breath and hemoptysis.

EXAM:
CHEST  2 VIEW

[Series 1: dg chest 2 view · 0.14mm/px · 2 of 2 slices shown]
[im 1/2]
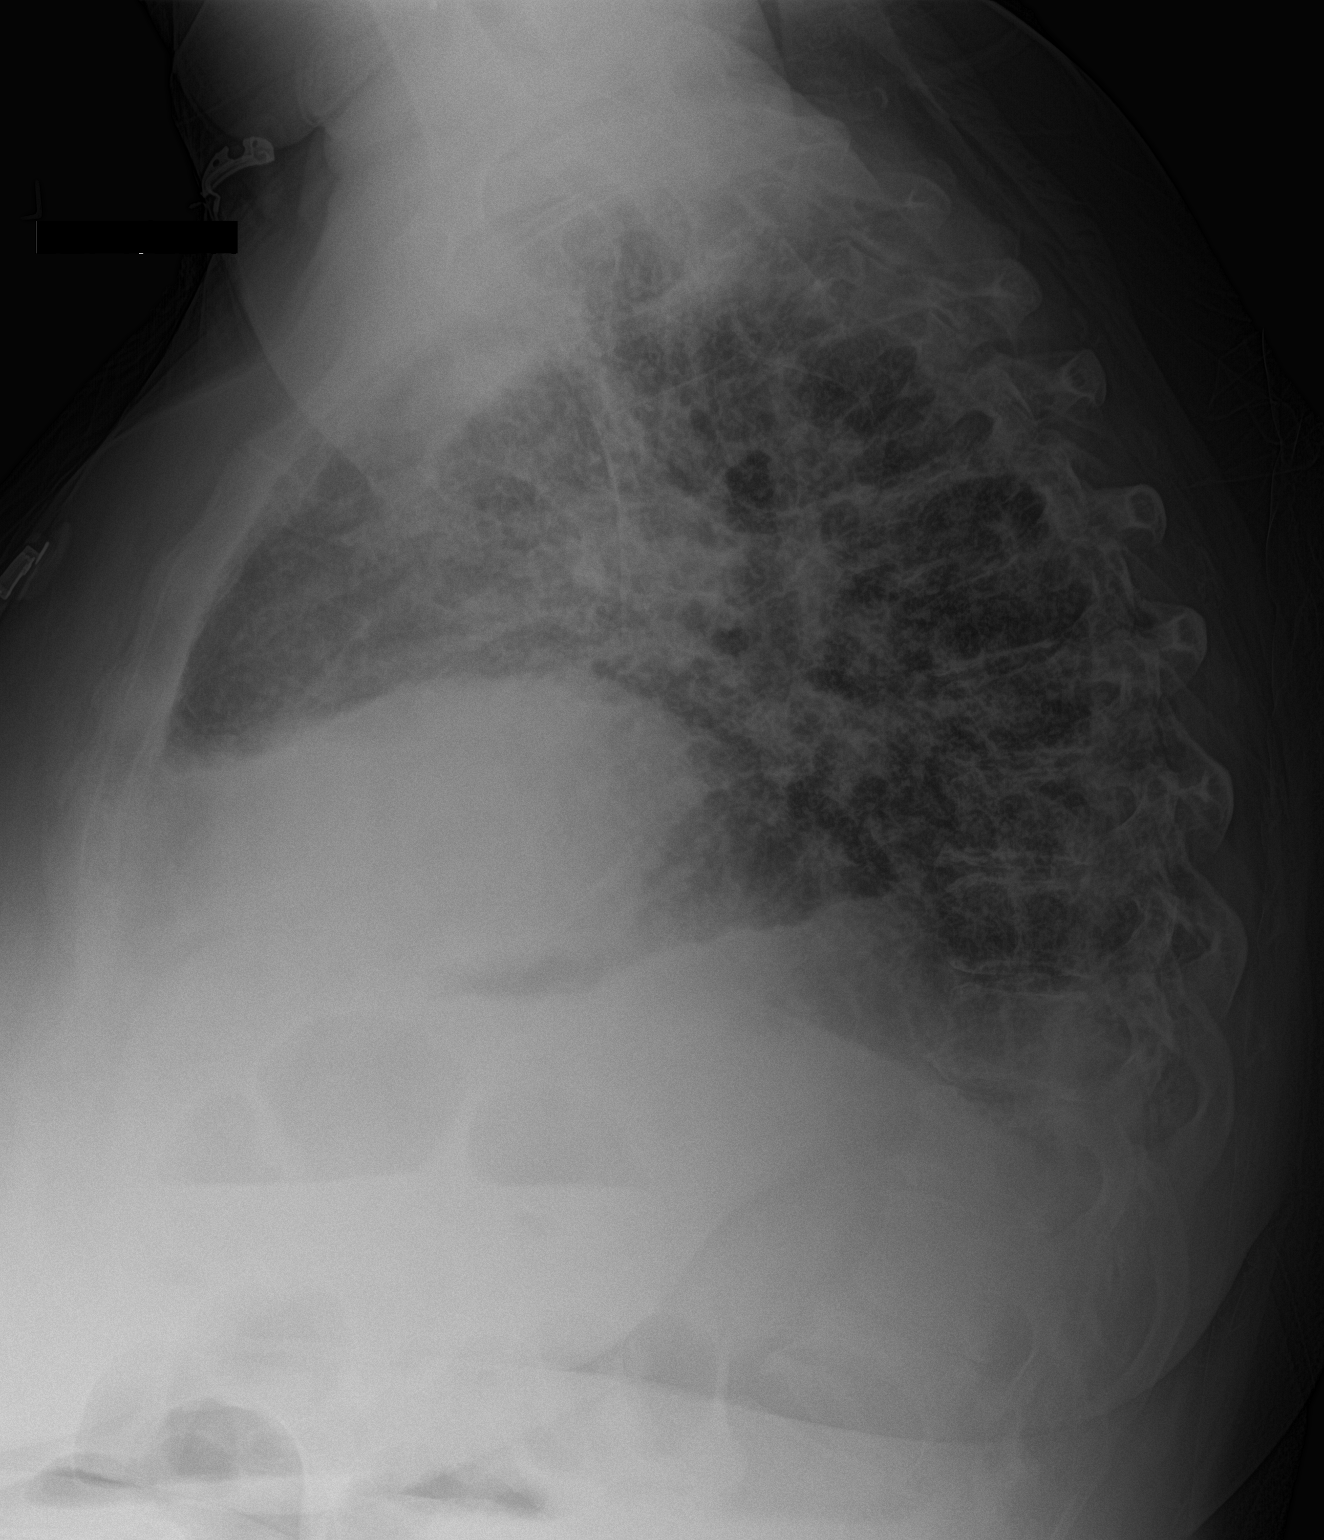
[im 2/2]
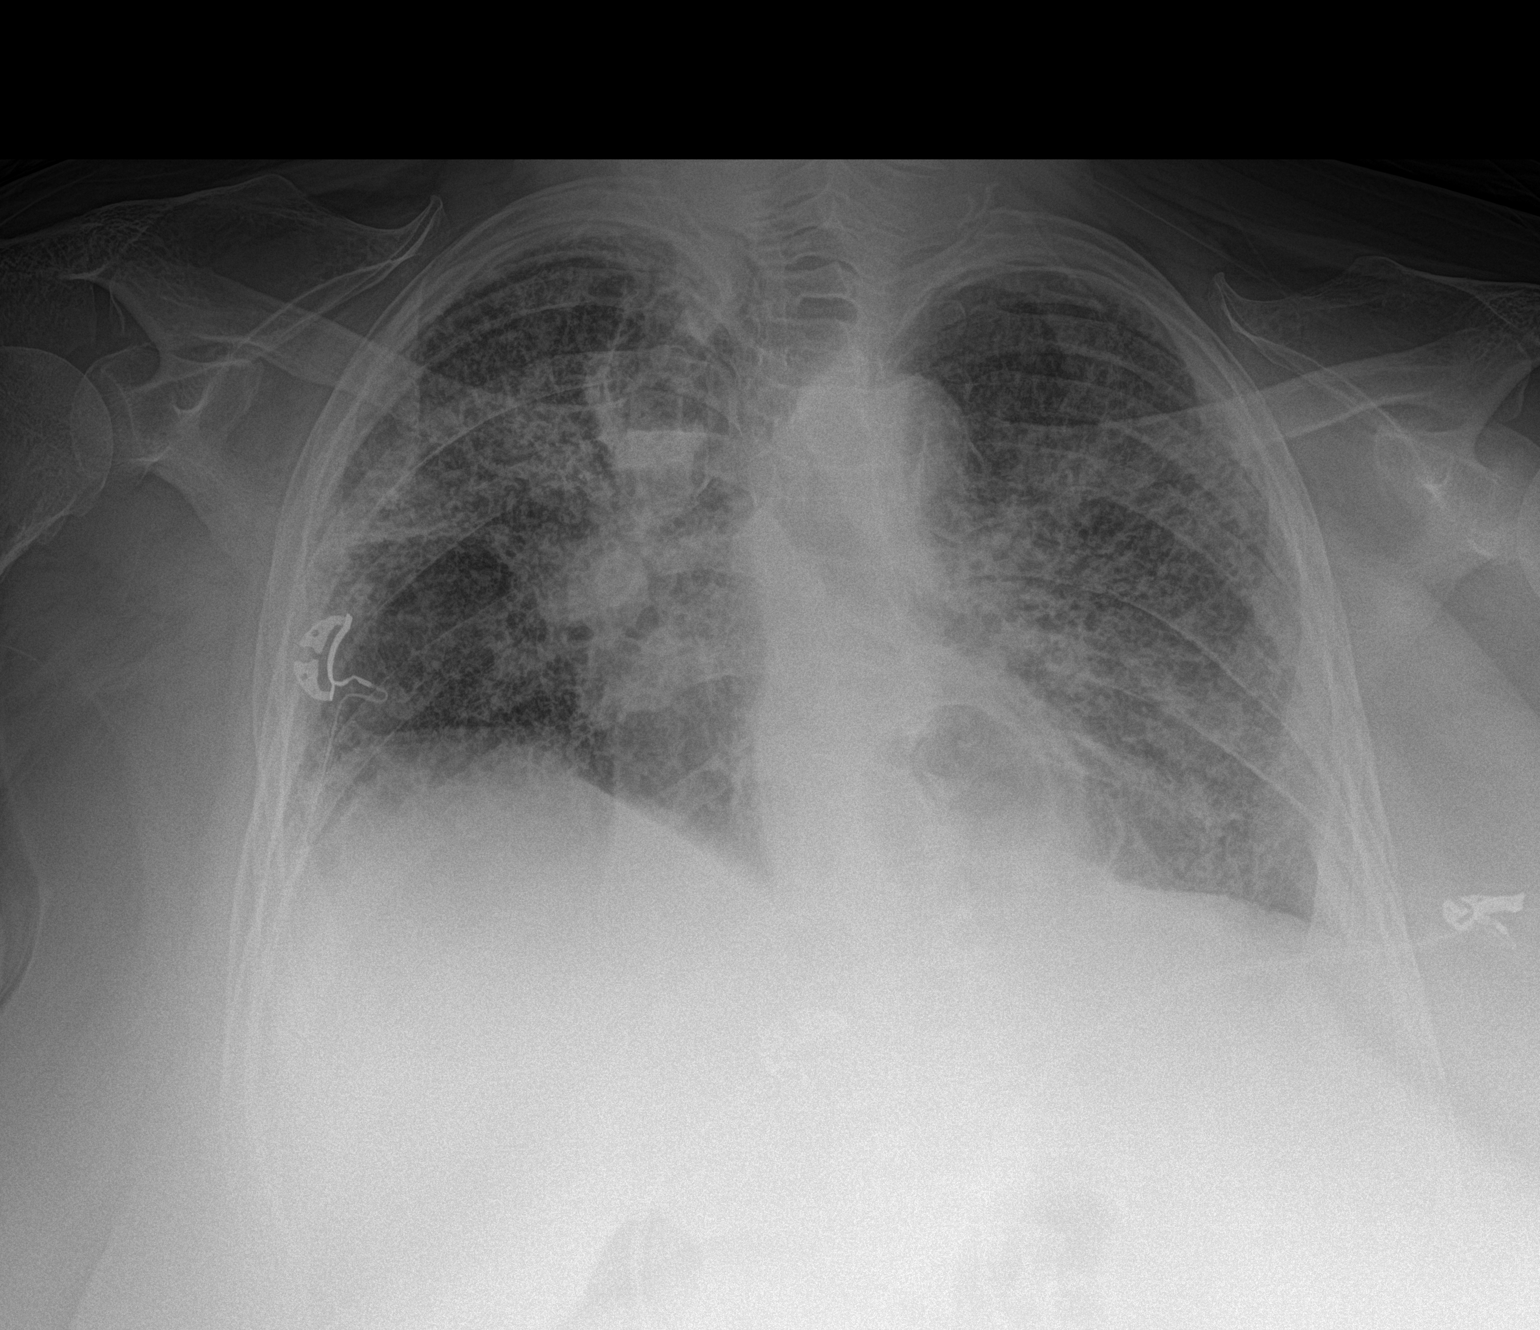

[2 of 2 positions shown; findings below may reference images not displayed]

FINDINGS: Heart size is within normal limits. Hiatal hernia. There is
interstitial coarsening in prominence bilaterally, possibly slightly
progressive from 11/26/2015, versus differences in technique. Right
hilar fullness is unchanged. No definite pleural fluid. Probable
pleural thickening at the left costophrenic angle.
IMPRESSION: Interstitial coarsening and prominence bilaterally, possibly
progressive from 11/26/2015. Findings can be seen with pulmonary
edema or a viral process superimposed on chronic interstitial lung
disease. Consider high-resolution chest CT in further evaluation, as
clinically indicated.
